# Patient Record
Sex: Female | Born: 1960 | Race: White | Hispanic: No | Marital: Married | Smoking: Never smoker
Health system: Southern US, Community
[De-identification: ages and names within clinical notes are randomized; demographics above are authoritative.]

## PROBLEM LIST (undated history)

## (undated) DIAGNOSIS — T7840XA Allergy, unspecified, initial encounter: Secondary | ICD-10-CM

## (undated) DIAGNOSIS — I739 Peripheral vascular disease, unspecified: Secondary | ICD-10-CM

## (undated) HISTORY — DX: Allergy, unspecified, initial encounter: T78.40XA

## (undated) HISTORY — PX: SALPINGOOPHORECTOMY: SHX82

## (undated) HISTORY — DX: Peripheral vascular disease, unspecified: I73.9

## (undated) HISTORY — PX: OTHER SURGICAL HISTORY: SHX169

## (undated) HISTORY — PX: COLPOSCOPY: SHX161

---

## 2012-06-29 DIAGNOSIS — Z8249 Family history of ischemic heart disease and other diseases of the circulatory system: Secondary | ICD-10-CM | POA: Insufficient documentation

## 2012-07-29 DIAGNOSIS — I491 Atrial premature depolarization: Secondary | ICD-10-CM | POA: Insufficient documentation

## 2012-07-29 DIAGNOSIS — I493 Ventricular premature depolarization: Secondary | ICD-10-CM | POA: Insufficient documentation

## 2012-08-07 DIAGNOSIS — R9431 Abnormal electrocardiogram [ECG] [EKG]: Secondary | ICD-10-CM | POA: Insufficient documentation

## 2012-10-01 DIAGNOSIS — I4949 Other premature depolarization: Secondary | ICD-10-CM | POA: Insufficient documentation

## 2012-10-01 DIAGNOSIS — R002 Palpitations: Secondary | ICD-10-CM | POA: Insufficient documentation

## 2013-12-29 ENCOUNTER — Ambulatory Visit: Payer: Self-pay | Admitting: Gastroenterology

## 2014-01-03 ENCOUNTER — Ambulatory Visit: Payer: Self-pay | Admitting: Gastroenterology

## 2014-02-10 DIAGNOSIS — R1013 Epigastric pain: Secondary | ICD-10-CM | POA: Insufficient documentation

## 2014-02-10 DIAGNOSIS — N83209 Unspecified ovarian cyst, unspecified side: Secondary | ICD-10-CM | POA: Insufficient documentation

## 2014-07-07 ENCOUNTER — Ambulatory Visit: Payer: Self-pay | Admitting: Unknown Physician Specialty

## 2014-08-04 DIAGNOSIS — M25561 Pain in right knee: Secondary | ICD-10-CM | POA: Insufficient documentation

## 2015-01-27 DIAGNOSIS — R140 Abdominal distension (gaseous): Secondary | ICD-10-CM | POA: Insufficient documentation

## 2015-01-27 DIAGNOSIS — K589 Irritable bowel syndrome without diarrhea: Secondary | ICD-10-CM | POA: Insufficient documentation

## 2016-02-01 HISTORY — PX: ESOPHAGOGASTRODUODENOSCOPY: SHX1529

## 2016-06-21 DIAGNOSIS — K219 Gastro-esophageal reflux disease without esophagitis: Secondary | ICD-10-CM | POA: Insufficient documentation

## 2016-06-21 DIAGNOSIS — K449 Diaphragmatic hernia without obstruction or gangrene: Secondary | ICD-10-CM

## 2016-08-06 ENCOUNTER — Other Ambulatory Visit: Payer: Self-pay | Admitting: Family Medicine

## 2016-08-06 DIAGNOSIS — K219 Gastro-esophageal reflux disease without esophagitis: Secondary | ICD-10-CM

## 2016-08-06 DIAGNOSIS — R142 Eructation: Secondary | ICD-10-CM

## 2016-08-13 ENCOUNTER — Ambulatory Visit
Admission: RE | Admit: 2016-08-13 | Discharge: 2016-08-13 | Disposition: A | Discharge status: Home / Self Care | Payer: 59 | Source: Ambulatory Visit | Attending: Family Medicine | Admitting: Family Medicine

## 2016-08-13 DIAGNOSIS — K317 Polyp of stomach and duodenum: Secondary | ICD-10-CM | POA: Diagnosis not present

## 2016-08-13 DIAGNOSIS — K449 Diaphragmatic hernia without obstruction or gangrene: Secondary | ICD-10-CM | POA: Insufficient documentation

## 2016-08-13 DIAGNOSIS — R142 Eructation: Secondary | ICD-10-CM | POA: Diagnosis present

## 2016-08-13 DIAGNOSIS — K219 Gastro-esophageal reflux disease without esophagitis: Secondary | ICD-10-CM

## 2016-08-26 DIAGNOSIS — H11003 Unspecified pterygium of eye, bilateral: Secondary | ICD-10-CM | POA: Insufficient documentation

## 2016-10-31 ENCOUNTER — Ambulatory Visit (INDEPENDENT_AMBULATORY_CARE_PROVIDER_SITE_OTHER): Payer: 59 | Admitting: Vascular Surgery

## 2016-10-31 ENCOUNTER — Encounter (INDEPENDENT_AMBULATORY_CARE_PROVIDER_SITE_OTHER): Payer: Self-pay | Admitting: Vascular Surgery

## 2016-10-31 DIAGNOSIS — J4520 Mild intermittent asthma, uncomplicated: Secondary | ICD-10-CM

## 2016-10-31 DIAGNOSIS — I8312 Varicose veins of left lower extremity with inflammation: Secondary | ICD-10-CM

## 2016-10-31 DIAGNOSIS — I10 Essential (primary) hypertension: Secondary | ICD-10-CM | POA: Insufficient documentation

## 2016-10-31 DIAGNOSIS — I872 Venous insufficiency (chronic) (peripheral): Secondary | ICD-10-CM | POA: Diagnosis not present

## 2016-10-31 DIAGNOSIS — M79609 Pain in unspecified limb: Secondary | ICD-10-CM | POA: Insufficient documentation

## 2016-10-31 DIAGNOSIS — J45909 Unspecified asthma, uncomplicated: Secondary | ICD-10-CM | POA: Insufficient documentation

## 2016-10-31 DIAGNOSIS — M79604 Pain in right leg: Secondary | ICD-10-CM | POA: Diagnosis not present

## 2016-10-31 DIAGNOSIS — I8311 Varicose veins of right lower extremity with inflammation: Secondary | ICD-10-CM | POA: Diagnosis not present

## 2016-10-31 DIAGNOSIS — M79605 Pain in left leg: Secondary | ICD-10-CM

## 2016-10-31 NOTE — Progress Notes (Signed)
MRN : [id number]  Barbara Glover is a 56 y.o. ([birth year]) female who presents with chief complaint of  Chief Complaint  Patient presents with  . New Evaluation    Varicose veins  .  History of Present Illness: The patient is seen for evaluation of symptomatic varicose veins. The patient relates burning and stinging which worsened steadily throughout the course of the day, particularly with standing. The patient also notes an aching and throbbing pain over the varicosities, particularly with prolonged dependent positions. The symptoms are significantly improved with elevation.  The patient also notes that during hot weather the symptoms are greatly intensified. The patient states the pain from the varicose veins interferes with work, daily exercise, shopping and household maintenance. At this point, the symptoms are persistent and severe enough that they're having a negative impact on lifestyle and are interfering with daily activities.  There is no history of DVT, PE or superficial thrombophlebitis. There is no history of ulceration or hemorrhage. The patient denies a significant family history of varicose veins.   The patient has not worn graduated compression in the past. At the present time the patient has not been using over-the-counter analgesics. There is no history of prior sclerotherapy which did not work.  However, she was told she has reflux and should have a laser ablation but she refused.   Current Meds  Medication Sig  . ALPRAZolam (XANAX) 0.25 MG tablet take 1 tablet by mouth at bedtime if needed  . B Complex Vitamins (VITAMIN B COMPLEX PO) Take by mouth.  . clonazePAM (KLONOPIN) 1 MG tablet   . Coenzyme Q10 100 MG capsule Take by mouth.  . Desvenlafaxine Succinate ER 25 MG TB24   . diltiazem (CARDIZEM) 30 MG tablet   . EPINEPHrine 0.3 mg/0.3 mL IJ SOAJ injection Inject 0.3 mg into the muscle.  . eszopiclone (LUNESTA) 1 MG TABS tablet   . fluticasone (FLONASE) 50  MCG/ACT nasal spray 1 spray by Each Nare route Two (2) times a day.  . montelukast (SINGULAIR) 10 MG tablet TAKE 1 TABLET BY MOUTH EVERY NIGHT AT BEDTIME  . Multiple Vitamins-Minerals (WOMENS MULTI) CAPS Take by mouth.  . prednisoLONE acetate (PRED FORTE) 1 % ophthalmic suspension INT 1 GTT REY QID  . vitamin C (ASCORBIC ACID) 500 MG tablet Take by mouth.    Past Medical History:  Diagnosis Date  . Allergy   . Peripheral vascular disease ([place])     No past surgical history on file.  Social History Social History  Substance Use Topics  . Smoking status: Never Smoker  . Smokeless tobacco: Never Used  . Alcohol use 1.2 oz/week    2 Glasses of wine per week    Family History No family history on file. No family history of bleeding/clotting disorders, porphyria or autoimmune disease   Allergies  Allergen Reactions  . Morphine     Other reaction(s): Nausea And Vomiting  . Bee Venom Swelling  . Codeine Nausea Only  . Protamine     Other reaction(s): Other (See Comments) Acute asthma attack  . Statins     Other reaction(s): Muscle Pain     REVIEW OF SYSTEMS (Negative unless checked)  Constitutional: [] Weight loss  [] Fever  [] Chills Cardiac: [] Chest pain   [] Chest pressure   [] Palpitations   [] Shortness of breath when laying flat   [] Shortness of breath with exertion. Vascular:  [] Pain in legs with walking   [] Pain in legs at rest  [] History of DVT   []   Phlebitis   [x] Swelling in legs   [x] Varicose veins   [] Non-healing ulcers Pulmonary:   [] Uses home oxygen   [] Productive cough   [] Hemoptysis   [] Wheeze  [] COPD   [] Asthma Neurologic:  [] Dizziness   [] Seizures   [] History of stroke   [] History of TIA  [] Aphasia   [] Vissual changes   [] Weakness or numbness in arm   [] Weakness or numbness in leg Musculoskeletal:   [] Joint swelling   [] Joint pain   [] Low back pain Hematologic:  [] Easy bruising  [] Easy bleeding   [] Hypercoagulable state   [] Anemic Gastrointestinal:  [] Diarrhea    [] Vomiting  [] Gastroesophageal reflux/heartburn   [] Difficulty swallowing. Genitourinary:  [] Chronic kidney disease   [] Difficult urination  [] Frequent urination   [] Blood in urine Skin:  [] Rashes   [] Ulcers  Psychological:  [] History of anxiety   []  History of major depression.  Physical Examination  Vitals:   10/31/16 1109  BP: (!) 143/86  Pulse: 82  Resp: 16  Weight: 194 lb (88 kg)  Height: 5' 4.5" (1.638 m)   Body mass index is 32.79 kg/m. Gen: WD/WN, NAD Head: [place]/AT, No temporalis wasting.  Ear/Nose/Throat: Hearing grossly intact, nares w/o erythema or drainage, poor dentition Eyes: PER, EOMI, sclera nonicteric.  Neck: Supple, no masses.  No bruit or JVD.  Pulmonary:  Good air movement, clear to auscultation bilaterally, no use of accessory muscles.  Cardiac: RRR, normal S1, S2, no Murmurs. Vascular:  Bilateral varicose veins of the lower extremities with >8 mm size and moderate venous stasis disease Vessel Right Left  Radial Palpable Palpable  Ulnar Palpable Palpable  Brachial Palpable Palpable  Carotid Palpable Palpable  Femoral Palpable Palpable  Popliteal Palpable Palpable  PT Palpable Palpable  DP Palpable Palpable   Gastrointestinal: soft, non-distended. No guarding/no peritoneal signs.  Musculoskeletal: M/S 5/5 throughout.  No deformity or atrophy.  Neurologic: CN 2-12 intact. Pain and light touch intact in extremities.  Symmetrical.  Speech is fluent. Motor exam as listed above. Psychiatric: Judgment intact, Mood & affect appropriate for pt's clinical situation. Dermatologic: No rashes or ulcers noted.  No changes consistent with cellulitis. Lymph : No Cervical lymphadenopathy, no lichenification or skin changes of chronic lymphedema.  CBC No results found for: WBC, HGB, HCT, MCV, PLT  BMET No results found for: NA, K, CL, CO2, GLUCOSE, BUN, CREATININE, CALCIUM, GFRNONAA, GFRAA CrCl cannot be calculated (No order found.).  COAG No results found for: INR,  PROTIME  Radiology No results found.  Assessment/Plan 1. Pain in both lower extremities  Recommend:  The patient has large symptomatic varicose veins that are painful and associated with swelling.  I have had a long discussion with the patient regarding  varicose veins and why they cause symptoms.  Patient will begin wearing graduated compression stockings class 1 on a daily basis, beginning first thing in the morning and removing them in the evening. The patient is instructed specifically not to sleep in the stockings.    The patient  will also begin using over-the-counter analgesics such as Motrin 600 mg po TID to help control the symptoms.    In addition, behavioral modification including elevation during the day will be initiated.    Pending the results of these changes the  patient will be reevaluated in three months.   An  ultrasound of the venous system will be obtained.   Further plans will be based on the ultrasound results and whether conservative therapies are successful at eliminating the pain and swelling.  2. Varicose veins of both lower extremities with inflammation See #1 - VAS [place] LOWER EXTREMITY VENOUS REFLUX; Future  3. Chronic venous insufficiency See #1  4. Essential hypertension Continue antihypertensive medications as already ordered, these medications have been reviewed and there are no changes at this time.   5. Mild intermittent chronic asthma without complication Continue pulmonary medications and aerosols as already ordered, these medications have been reviewed and there are no changes at this time.   Hortencia Pilar, MD  10/31/2016 1:34 PM

## 2017-01-30 ENCOUNTER — Ambulatory Visit (INDEPENDENT_AMBULATORY_CARE_PROVIDER_SITE_OTHER): Payer: 59

## 2017-01-30 ENCOUNTER — Ambulatory Visit (INDEPENDENT_AMBULATORY_CARE_PROVIDER_SITE_OTHER): Payer: 59 | Admitting: Vascular Surgery

## 2017-01-30 ENCOUNTER — Encounter (INDEPENDENT_AMBULATORY_CARE_PROVIDER_SITE_OTHER): Payer: Self-pay | Admitting: Vascular Surgery

## 2017-01-30 VITALS — BP 133/87 | HR 72 | Resp 16 | Wt 195.0 lb

## 2017-01-30 DIAGNOSIS — I872 Venous insufficiency (chronic) (peripheral): Secondary | ICD-10-CM

## 2017-01-30 DIAGNOSIS — I8311 Varicose veins of right lower extremity with inflammation: Secondary | ICD-10-CM | POA: Diagnosis not present

## 2017-01-30 DIAGNOSIS — I10 Essential (primary) hypertension: Secondary | ICD-10-CM

## 2017-01-30 DIAGNOSIS — M79605 Pain in left leg: Secondary | ICD-10-CM | POA: Diagnosis not present

## 2017-01-30 DIAGNOSIS — I8312 Varicose veins of left lower extremity with inflammation: Secondary | ICD-10-CM

## 2017-01-30 DIAGNOSIS — M79604 Pain in right leg: Secondary | ICD-10-CM | POA: Diagnosis not present

## 2017-02-02 NOTE — Progress Notes (Signed)
MRN : [id number]  Barbara Glover is a 56 y.o. ([birth year]) female who presents with chief complaint of  Chief Complaint  Patient presents with  . Follow-up  .  History of Present Illness: The patient returns for followup evaluation 3 months after the initial visit. The patient continues to have pain in the lower extremities with dependency. The pain is lessened with elevation. Graduated compression stockings, Class I (20-30 mmHg), have been worn but the stockings do not eliminate the leg pain. Over-the-counter analgesics do not improve the symptoms. The degree of discomfort continues to interfere with daily activities. The patient notes the pain in the legs is causing problems with daily exercise, at the workplace and even with household activities and maintenance such as standing in the kitchen preparing meals and doing dishes.   Venous ultrasound shows normal deep venous system, no evidence of acute or chronic DVT.  Superficial reflux is present in bilateral GSV and SSV.  Current Meds  Medication Sig  . B Complex Vitamins (VITAMIN B COMPLEX PO) Take by mouth.  . bismuth subsalicylate (PEPTO BISMOL) 262 MG/15ML suspension Take 30 mLs by mouth daily.  . clonazePAM (KLONOPIN) 1 MG tablet 0.5 mg as needed.   . Coenzyme Q10 100 MG capsule Take by mouth.  . Desvenlafaxine Succinate ER 25 MG TB24 25 mg.   . EPINEPHrine 0.3 mg/0.3 mL IJ SOAJ injection Inject 0.3 mg into the muscle.  . eszopiclone (LUNESTA) 1 MG TABS tablet   . Multiple Vitamins-Minerals (WOMENS MULTI) CAPS Take by mouth.  . omega-3 acid ethyl esters (LOVAZA) 1 g capsule Take by mouth daily.  . prednisoLONE acetate (PRED FORTE) 1 % ophthalmic suspension INT 1 GTT REY QID  . vitamin C (ASCORBIC ACID) 500 MG tablet Take by mouth.    Past Medical History:  Diagnosis Date  . Allergy   . Peripheral vascular disease ([place])     No past surgical history on file.  Social History Social History  Substance Use Topics  .  Smoking status: Never Smoker  . Smokeless tobacco: Never Used  . Alcohol use 1.2 oz/week    2 Glasses of wine per week    Family History No family history of bleeding/clotting disorders, porphyria or autoimmune disease   Allergies  Allergen Reactions  . Morphine     Other reaction(s): Nausea And Vomiting  . Bee Venom Swelling  . Codeine Nausea Only  . Protamine     Other reaction(s): Other (See Comments) Acute asthma attack  . Statins     Other reaction(s): Muscle Pain     REVIEW OF SYSTEMS (Negative unless checked)  Constitutional: [] Weight loss  [] Fever  [] Chills Cardiac: [] Chest pain   [] Chest pressure   [] Palpitations   [] Shortness of breath when laying flat   [] Shortness of breath with exertion. Vascular:  [] Pain in legs with walking   [x] Pain in legs with standing  [] History of DVT   [] Phlebitis   [x] Swelling in legs   [x] Varicose veins   [] Non-healing ulcers Pulmonary:   [] Uses home oxygen   [] Productive cough   [] Hemoptysis   [] Wheeze  [] COPD   [] Asthma Neurologic:  [] Dizziness   [] Seizures   [] History of stroke   [] History of TIA  [] Aphasia   [] Vissual changes   [] Weakness or numbness in arm   [] Weakness or numbness in leg Musculoskeletal:   [] Joint swelling   [] Joint pain   [] Low back pain Hematologic:  [] Easy bruising  [] Easy bleeding   [] Hypercoagulable state   []   Anemic Gastrointestinal:  [] Diarrhea   [] Vomiting  [] Gastroesophageal reflux/heartburn   [] Difficulty swallowing. Genitourinary:  [] Chronic kidney disease   [] Difficult urination  [] Frequent urination   [] Blood in urine Skin:  [] Rashes   [] Ulcers  Psychological:  [] History of anxiety   []  History of major depression.  Physical Examination  Vitals:   01/30/17 1536  BP: 133/87  Pulse: 72  Resp: 16  Weight: 195 lb (88.5 kg)   Body mass index is 32.95 kg/m. Gen: WD/WN, NAD Head: [place]/AT, No temporalis wasting.  Ear/Nose/Throat: Hearing grossly intact, nares w/o erythema or drainage Eyes: PER, EOMI,  sclera nonicteric.  Neck: Supple, no large masses.   Pulmonary:  Good air movement, no audible wheezing bilaterally, no use of accessory muscles.  Cardiac: RRR, no JVD Vascular: Large varicosities present extensively greater than 8 mm bilaterally.  Moderate venous stasis changes to the legs bilaterally.  2+ soft pitting edema Vessel Right Left  PT Palpable Palpable  DP Palpable Palpable  Gastrointestinal: Non-distended. No guarding/no peritoneal signs.  Musculoskeletal: M/S 5/5 throughout.  No deformity or atrophy.  Neurologic: CN 2-12 intact. Symmetrical.  Speech is fluent. Motor exam as listed above. Psychiatric: Judgment intact, Mood & affect appropriate for pt's clinical situation. Dermatologic:venous rashes but no ulcers noted.  No changes consistent with cellulitis. Lymph : No lichenification or skin changes of chronic lymphedema.  CBC No results found for: WBC, HGB, HCT, MCV, PLT  BMET No results found for: NA, K, CL, CO2, GLUCOSE, BUN, CREATININE, CALCIUM, GFRNONAA, GFRAA CrCl cannot be calculated (No order found.).  COAG No results found for: INR, PROTIME  Radiology No results found.  Assessment/Plan 1. Varicose veins of both lower extremities with inflammation Recommend  I have reviewed my previous  discussion with the patient regarding  varicose veins and why they cause symptoms. Patient will continue  wearing graduated compression stockings class 1 on a daily basis, beginning first thing in the morning and removing them in the evening.    In addition, behavioral modification including elevation during the day was again discussed and this will continue.  The patient has utilized over the counter pain medications and has been exercising.  However, at this time conservative therapy has not alleviated the patient's symptoms of leg pain and swelling  Recommend: laser ablation of the right and  left great saphenous and small veins to eliminate the symptoms of pain and  swelling of the lower extremities caused by the severe superficial venous reflux disease. Her left leg is more painful so this one will be addressed first  2. Chronic venous insufficiency No surgery or intervention at this point in time.    I have had a long discussion with the patient regarding venous insufficiency and why it  causes symptoms. I have discussed with the patient the chronic skin changes that accompany venous insufficiency and the long term sequela such as infection and ulceration.  Patient will begin wearing graduated compression stockings class 1 (20-30 mmHg) or compression wraps on a daily basis a prescription was given. The patient will put the stockings on first thing in the morning and removing them in the evening. The patient is instructed specifically not to sleep in the stockings.    In addition, behavioral modification including several periods of elevation of the lower extremities during the day will be continued. I have demonstrated that proper elevation is a position with the ankles at heart level.  The patient is instructed to begin routine exercise, especially walking on a daily  basis  Following the review of the ultrasound the patient will follow up in 2-3 months to reassess the degree of swelling and the control that graduated compression stockings or compression wraps  is offering.   The patient can be assessed for a Lymph Pump at that time  3. Essential hypertension Continue antihypertensive medications as already ordered, these medications have been reviewed and there are no changes at this time.   4. Pain in both lower extremities See #1&2    Hortencia Pilar, MD  02/02/2017 6:02 PM

## 2017-03-24 ENCOUNTER — Other Ambulatory Visit: Payer: Self-pay | Admitting: Vascular Surgery

## 2017-03-24 DIAGNOSIS — I8311 Varicose veins of right lower extremity with inflammation: Secondary | ICD-10-CM

## 2017-03-24 DIAGNOSIS — I8312 Varicose veins of left lower extremity with inflammation: Principal | ICD-10-CM

## 2017-06-20 DIAGNOSIS — R142 Eructation: Secondary | ICD-10-CM | POA: Insufficient documentation

## 2017-08-23 ENCOUNTER — Encounter: Payer: Self-pay | Admitting: Gynecology

## 2017-08-23 ENCOUNTER — Other Ambulatory Visit: Payer: Self-pay

## 2017-08-23 ENCOUNTER — Ambulatory Visit
Admission: EM | Admit: 2017-08-23 | Discharge: 2017-08-23 | Disposition: A | Discharge status: Home / Self Care | Payer: 59 | Attending: Emergency Medicine | Admitting: Emergency Medicine

## 2017-08-23 DIAGNOSIS — K122 Cellulitis and abscess of mouth: Secondary | ICD-10-CM | POA: Diagnosis not present

## 2017-08-23 LAB — RAPID STREP SCREEN (MED CTR MEBANE ONLY): STREPTOCOCCUS, GROUP A SCREEN (DIRECT): NEGATIVE

## 2017-08-23 MED ORDER — AMOXICILLIN-POT CLAVULANATE 875-125 MG PO TABS: 1.0000 | ORAL_TABLET | Freq: Two times a day (BID) | ORAL | 0 refills | Status: AC

## 2017-08-23 NOTE — ED Provider Notes (Signed)
HPI  SUBJECTIVE:  Patient reports sore throat starting  this morning.  States that she feels like something is stuck in her throat.  Symptoms are worse with swallowing.  No alleviating factors. No fevers No swollen neck glands    + Cough/URI sxs with nasal congestion, rhinorrhea, postnasal drip, but she states that this is unchanged from baseline + Myalgias No Headache No Rash     No Recent Strep or mono exposure, however she has sick contacts with URI symptoms No Abdominal Pain No reflux sxs No Allergy sxs  No Breathing difficulty, voice changes No Drooling No Trismus No abx in past month.  Patient has a past medical history of severe GERD/esophagitis, pneumonia, seasonal allergies.  No history of recurrent strep, mono, diabetes, hypertension.  She is not a smoker.   NAT:[id number], Titus Mould, DO    Past Medical History:  Diagnosis Date  . Allergy   . Peripheral vascular disease ([place])     History reviewed. No pertinent surgical history.  No family history on file.  Social History   Tobacco Use  . Smoking status: Never Smoker  . Smokeless tobacco: Never Used  Substance Use Topics  . Alcohol use: Yes    Alcohol/week: 1.2 oz    Types: 2 Glasses of wine per week  . Drug use: No    No current facility-administered medications for this encounter.   Current Outpatient Medications:  .  B Complex Vitamins (VITAMIN B COMPLEX PO), Take by mouth., Disp: , Rfl:  .  bismuth subsalicylate (PEPTO BISMOL) 262 MG/15ML suspension, Take 30 mLs by mouth daily., Disp: , Rfl:  .  clonazePAM (KLONOPIN) 1 MG tablet, 0.5 mg as needed. , Disp: , Rfl:  .  Coenzyme Q10 100 MG capsule, Take by mouth., Disp: , Rfl:  .  Desvenlafaxine Succinate ER 25 MG TB24, 25 mg. , Disp: , Rfl:  .  diltiazem (CARDIZEM) 30 MG tablet, , Disp: , Rfl:  .  EPINEPHrine 0.3 mg/0.3 mL IJ SOAJ injection, Inject 0.3 mg into the muscle., Disp: , Rfl:  .  eszopiclone (LUNESTA) 1 MG TABS tablet, , Disp: ,  Rfl:  .  fluticasone (FLONASE) 50 MCG/ACT nasal spray, 1 spray by Each Nare route Two (2) times a day., Disp: , Rfl:  .  montelukast (SINGULAIR) 10 MG tablet, TAKE 1 TABLET BY MOUTH EVERY NIGHT AT BEDTIME, Disp: , Rfl:  .  Multiple Vitamins-Minerals (WOMENS MULTI) CAPS, Take by mouth., Disp: , Rfl:  .  omega-3 acid ethyl esters (LOVAZA) 1 g capsule, Take by mouth daily., Disp: , Rfl:  .  vitamin C (ASCORBIC ACID) 500 MG tablet, Take by mouth., Disp: , Rfl:  .  ALPRAZolam (XANAX) 0.25 MG tablet, take 1 tablet by mouth at bedtime if needed, Disp: , Rfl:  .  amoxicillin-clavulanate (AUGMENTIN) 875-125 MG tablet, Take 1 tablet by mouth 2 (two) times daily., Disp: 20 tablet, Rfl: 0 .  prednisoLONE acetate (PRED FORTE) 1 % ophthalmic suspension, INT 1 GTT REY QID, Disp: , Rfl: 0  Allergies  Allergen Reactions  . Morphine     Other reaction(s): Nausea And Vomiting  . Bee Venom Swelling  . Codeine Nausea Only  . Protamine     Other reaction(s): Other (See Comments) Acute asthma attack  . Statins     Other reaction(s): Muscle Pain     ROS  As noted in HPI.   Physical Exam  BP 134/84 (BP Location: Left Arm)   Pulse 94   Temp  98.4 F (36.9 C)   Resp 16   Wt 180 lb (81.6 kg)   LMP  (LMP Unknown)   SpO2 98%   BMI 30.42 kg/m   Constitutional: Well developed, well nourished, no acute distress Eyes:  EOMI, conjunctiva normal bilaterally HENT: Normocephalic, atraumatic,mucus membranes moist. + nasal congestion - erythematous oropharynx - enlarged tonsils - exudates. Uvula midline, edematous, slightly swollen.  No obvious postnasal drip. Respiratory: Normal inspiratory effort Cardiovascular: Normal rate, no murmurs, rubs, gallops GI: nondistended, nontender. No appreciable splenomegaly skin: No rash, skin intact Lymph: -cervical LN  Musculoskeletal: no deformities Neurologic: Alert & oriented x 3, no focal neuro deficits Psychiatric: Speech and behavior appropriate.  ED  Course   Medications - No data to display  Orders Placed This Encounter  Procedures  . Rapid strep screen    Standing Status:   Standing    Number of Occurrences:   1    Order Specific Question:   Patient immune status    Answer:   Normal  . Culture, group A strep    Standing Status:   Standing    Number of Occurrences:   1    No results found for this or any previous visit (from the past 24 hour(s)). No results found.  ED Clinical Impression  Uvulitis   ED Assessment/Plan   Rapid strep negative.  However, pt with uvular swelling,  viral versus bacterial however will err on the side of caution and treat as if this is a bacterial infection.  No evidence of airway obstruction.  Doubt epiglottitis, allergic reaction, retropharyngeal abscess, peritonsillar abscess, dental infection.  We will send home with Augmentin, ibu 600 mg take with 1 g of Tylenol 3-4 times a day . Patient to followup with PMD when necessary.   Discussed labs,  MDM, plan and followup with patient. Discussed sn/sx that should prompt return to the ED. patient agrees with plan.   Meds ordered this encounter  Medications  . amoxicillin-clavulanate (AUGMENTIN) 875-125 MG tablet    Sig: Take 1 tablet by mouth 2 (two) times daily.    Dispense:  20 tablet    Refill:  0     *This clinic note was created using [employer]. Therefore, there may be occasional mistakes despite careful proofreading.    Melynda Ripple, MD 08/24/17 1147

## 2017-08-23 NOTE — ED Triage Notes (Signed)
Per patient sore throat. Per patient felt like something stuck in her throat.

## 2017-08-23 NOTE — Discharge Instructions (Signed)
600 mg of ibuprofen to take with 1 g of Tylenol 3-4 times a day.  Start the Augmentin, this could be viral, but often this is caused by bacterial infections.

## 2017-08-26 LAB — CULTURE, GROUP A STREP (THRC)

## 2017-11-12 ENCOUNTER — Encounter: Payer: Self-pay | Admitting: Gastroenterology

## 2017-11-12 ENCOUNTER — Ambulatory Visit: Payer: 59 | Admitting: Gastroenterology

## 2017-11-12 ENCOUNTER — Other Ambulatory Visit: Payer: Self-pay

## 2017-11-12 VITALS — BP 129/73 | HR 82 | Ht 64.5 in | Wt 191.0 lb

## 2017-11-12 DIAGNOSIS — K5790 Diverticulosis of intestine, part unspecified, without perforation or abscess without bleeding: Secondary | ICD-10-CM | POA: Insufficient documentation

## 2017-11-12 DIAGNOSIS — F419 Anxiety disorder, unspecified: Secondary | ICD-10-CM | POA: Insufficient documentation

## 2017-11-12 DIAGNOSIS — R1013 Epigastric pain: Secondary | ICD-10-CM | POA: Diagnosis not present

## 2017-11-12 DIAGNOSIS — R142 Eructation: Secondary | ICD-10-CM | POA: Diagnosis not present

## 2017-11-12 DIAGNOSIS — D279 Benign neoplasm of unspecified ovary: Secondary | ICD-10-CM | POA: Insufficient documentation

## 2017-11-12 NOTE — Progress Notes (Signed)
Gastroenterology Consultation  Referring Provider:     Garald Balding* Primary Care Physician:  Gearldine Shown, DO Primary Gastroenterologist:  Dr. Allen Norris     Reason for Consultation:     Dyspepsia        HPI:   Barbara Glover is a 57 y.o. y/o female referred for consultation & management of Dyspepsia by Dr. Bobette Mo, Titus Mould, DO.  This patient comes today after being seen initially in [facility] by two different physicians for excessive burping.  The patient also reports that she has had abdominal bloating with abdominal pain.  She reports that all of these symptoms had started when she moved to [place] 5 years ago.  She also reports suffering from postnasal drip and has had an upper endoscopy in the past that showed LA grade B esophagitis caused by reflux.  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  The patient also had a negative hydrogen breath test.  The patient now comes in today because she continues to have symptoms which can happen in on a daily basis and she states usually happens at night.  She reports that she starts to burp large amounts of air.  This continues for many hours and she states that she is not able to get much sleep because of it and has not gone back to work because she has been having lack of sleep due to the burping.  The patient also states that she has been treated by physicians in the past for irritable bowel syndrome, depression and has been on multiple PPIs without any relief of her symptoms.  The patient also reports that she has intermittent bouts of diarrhea and she had an episode last night with watery diarrhea that lasted few hours. She reports that he did not have heartburn despite having esophagitis in the past.  She also states that when she does burp it is reported to be a wet burps with fluid coming up also.  The patient has a negative  celiac test in the past and states that she eats well and avoids foods that may worsen her symptoms.  She also has tried probiotics and abdominal massage.There is no report of any unexplained weight loss.  Denies chewing gum, no straw use, no sleep apnea machine, no artificial sweeteners, no carbonated beverages. Patient reports that she has a very strict diet - avoids eating out, tries to avoid lactose containing products. Does not eat after 6pm.   The patient has also been under a lot of stress since her husband has not been able to work for the last 5 months due to not being able to find a job.    Past Medical History:  Diagnosis Date  . Allergy   . Peripheral vascular disease [facility])     Past Surgical History:  Procedure Laterality Date  . COLPOSCOPY    . ESOPHAGOGASTRODUODENOSCOPY  02/01/2016  . SALPINGOOPHORECTOMY Right   . uterine ablation      Prior to Admission medications   Medication Sig Start Date End Date Taking? Authorizing Provider  B Complex Vitamins (VITAMIN B COMPLEX PO) Take by mouth.   Yes [provider]  bismuth subsalicylate (PEPTO BISMOL) 262 MG/15ML suspension Take 30 mLs by mouth daily.   Yes [provider]  Calcium Carbonate-Vitamin D (CALCIUM HIGH POTENCY/VITAMIN D) 600-200 MG-UNIT TABS Take by mouth.   Yes [provider]  clonazePAM Bobbye Charleston) 1  MG tablet 0.5 mg as needed.  10/22/16  Yes [provider]  Coenzyme Q10 100 MG capsule Take by mouth.   Yes [provider]  Desvenlafaxine Succinate ER 25 MG TB24 25 mg.  10/22/16  Yes [provider]  EPINEPHrine 0.3 mg/0.3 mL IJ SOAJ injection Inject 0.3 mg into the muscle. 05/02/14  Yes [provider]  fluticasone (FLONASE) 50 MCG/ACT nasal spray 1 spray by Each Nare route Two (2) times a day. 02/10/14  Yes [provider]  montelukast (SINGULAIR) 10 MG tablet TAKE 1 TABLET BY MOUTH EVERY NIGHT AT BEDTIME 05/12/13  Yes [provider]    Multiple Vitamins-Minerals (WOMENS MULTI) CAPS Take by mouth.   Yes [provider]  omega-3 acid ethyl esters (LOVAZA) 1 g capsule Take by mouth daily.   Yes [provider]  ondansetron (ZOFRAN) 8 MG tablet  10/22/17  Yes [provider]  vitamin C (ASCORBIC ACID) 500 MG tablet Take by mouth.   Yes [provider]  zolpidem (AMBIEN) 5 MG tablet  10/15/17  Yes [provider]  ALPRAZolam Duanne Moron) 0.25 MG tablet take 1 tablet by mouth at bedtime if needed 04/02/13   [provider]  amoxicillin-clavulanate (AUGMENTIN) 875-125 MG tablet Take 1 tablet by mouth 2 (two) times daily. Patient not taking: Reported on 11/12/2017 08/23/17   Melynda Ripple, MD  diltiazem (CARDIZEM) 30 MG tablet  08/01/16   [provider]  eszopiclone (LUNESTA) 1 MG TABS tablet  05/22/16   [provider]  levalbuterol (XOPENEX) 1.25 MG/3ML nebulizer solution INHALE CONTENTS 1 VIAL VIA NEBULIZER EVERY 6 HOURS AS NEEDED FOR SHORTNESS OF BREATH/ WHEEZING 10/25/16   [provider]  prednisoLONE acetate (PRED FORTE) 1 % ophthalmic suspension INT 1 GTT REY QID 09/13/16   [provider]  ranitidine (ZANTAC) 150 MG capsule Take by mouth. 12/31/16 12/31/17  [provider]    Family History  Problem Relation Age of Onset  . Alcohol abuse Mother   . Anxiety disorder Mother   . Bipolar disorder Mother   . Hypertension Mother   . Coronary artery disease Brother   . Hyperlipidemia Maternal Grandmother   . Hypertension Maternal Grandmother   . Obesity Maternal Grandmother   . Hyperlipidemia Maternal Grandfather   . Obesity Paternal Grandmother   . Stroke Paternal Grandmother   . Diabetes Mellitus II Paternal Grandfather      Social History   Tobacco Use  . Smoking status: Never Smoker  . Smokeless tobacco: Never Used  Substance Use Topics  . Alcohol use: Yes    Alcohol/week: 1.2 oz    Types: 2 Glasses of wine per week  .  Drug use: No    Allergies as of 11/12/2017 - Review Complete 11/12/2017  Allergen Reaction Noted  . Morphine  09/03/2016  . Bee venom Swelling 02/01/2016  . Codeine Nausea Only 06/07/2013  . Protamine  09/03/2016  . Statins  06/08/2015    Review of Systems:    All systems reviewed and negative except where noted in HPI.   Physical Exam:  BP 129/73   Pulse 82   Ht 5' 4.5" (1.638 m)   Wt 191 lb (86.6 kg)   LMP  (LMP Unknown)   BMI 32.28 kg/m  No LMP recorded (lmp unknown). Patient is postmenopausal. Psych:  Alert and cooperative. Normal mood and affect. General:   Alert,  Well-developed, well-nourished, pleasant and cooperative in NAD Head:  Normocephalic and atraumatic. Eyes:  Sclera  clear, no icterus.   Conjunctiva pink. Ears:  Normal auditory acuity. Nose:  No deformity, discharge, or lesions. Mouth:  No deformity or lesions,oropharynx pink & moist. Neck:  Supple; no masses or thyromegaly. Lungs:  Respirations even and unlabored.  Clear throughout to auscultation.   No wheezes, crackles, or rhonchi. No acute distress. Heart:  Regular rate and rhythm; no murmurs, clicks, rubs, or gallops. Abdomen:  Normal bowel sounds.  No bruits.  Soft, non-tender and non-distended without masses, hepatosplenomegaly or hernias noted.  No guarding or rebound tenderness.  Negative Carnett sign.   Rectal:  Deferred.  Msk:  Symmetrical without gross deformities.  Good, equal movement & strength bilaterally. Pulses:  Normal pulses noted. Extremities:  No clubbing or edema.  No cyanosis. Neurologic:  Alert and oriented x3;  grossly normal neurologically. Skin:  Intact without significant lesions or rashes.  No jaundice. Lymph Nodes:  No significant cervical adenopathy. Psych:  Alert and cooperative. Normal mood and affect.  Imaging Studies: No results found.  Assessment and Plan:   Barbara Glover is a 57 y.o. y/o female With what sounds like aerophagia.  The patient did not have any  burping during our visit today and from her last consult note does not appear that she had any burping at that time. The patient has been told that there must be a trigger to caused her to swallow excessive amounts of air.  The patient also has her symptoms in the evening and at night but typically not during the day.  She has been told to go back on a PPI since she had esophagitis in the past and reports that she has a hiatal hernia.  The patient has also been told that she should start taking the allergy medication she was prescribed in the past because chronic postnasal drip can cause her to swallow a lot of air thereby causing her burping and bloating. The patient has been explained the plan and agrees with it.  Lucilla Lame, MD. Marval Regal   Note: This dictation was prepared with Dragon dictation along with smaller phrase technology. Any transcriptional errors that result from this process are unintentional.

## 2018-02-02 ENCOUNTER — Ambulatory Visit (INDEPENDENT_AMBULATORY_CARE_PROVIDER_SITE_OTHER): Payer: 59 | Admitting: Vascular Surgery

## 2018-02-02 ENCOUNTER — Encounter (INDEPENDENT_AMBULATORY_CARE_PROVIDER_SITE_OTHER): Payer: 59

## 2018-07-25 IMAGING — RF DG UGI W/O KUB
12 series · 12 of 12 positions shown · non-contrast
Comparison: 01/03/2014.

CLINICAL DATA: Frequent belching. History of gastritis, hiatal
hernia, reflux.

EXAM:
UPPER GI SERIES WITHOUT KUB
TECHNIQUE: Routine upper GI series was performed with high density and thin
barium.
FLUOROSCOPY TIME:  Fluoroscopy Time:  1 minutes 20 seconds.
Radiation Exposure Index (if provided by the fluoroscopic device):
37.1 mGy
Number of Acquired Spot Images: 12

[Series 1: fluoro_barium singleshot_bw · 0.18mm/px · 1 of 1 slices shown (1 of 12)]
[im 1/1]
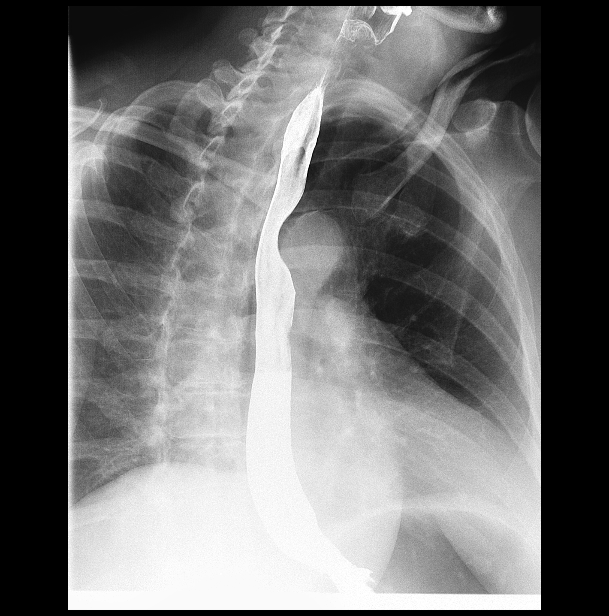

[Series 2: fluoro_barium singleshot_bw · 0.18mm/px · 1 of 1 slices shown (2 of 12)]
[im 1/1]
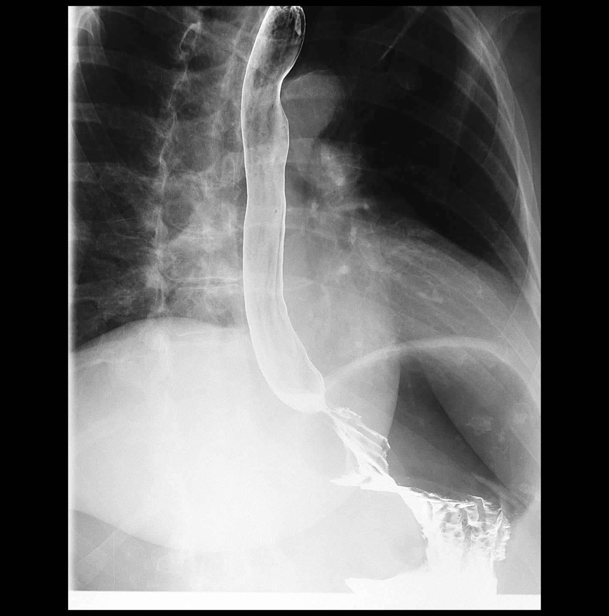

[Series 3: fluoro_barium singleshot_bw · 0.18mm/px · 1 of 1 slices shown (3 of 12)]
[im 1/1]
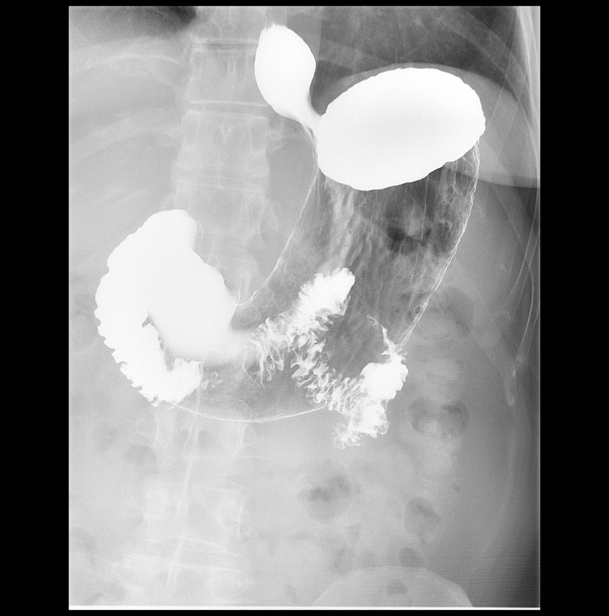

[Series 4: fluoro_barium singleshot_bw · 0.18mm/px · 1 of 1 slices shown (4 of 12)]
[im 1/1]
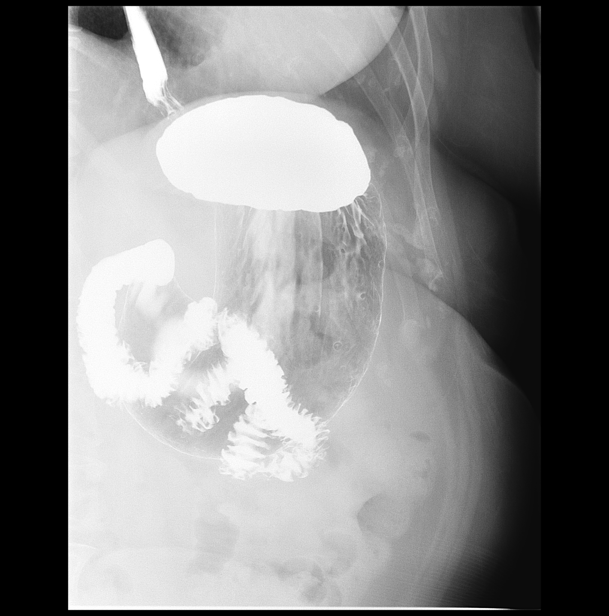

[Series 5: fluoro_barium singleshot_bw · 0.18mm/px · 1 of 1 slices shown (5 of 12)]
[im 1/1]
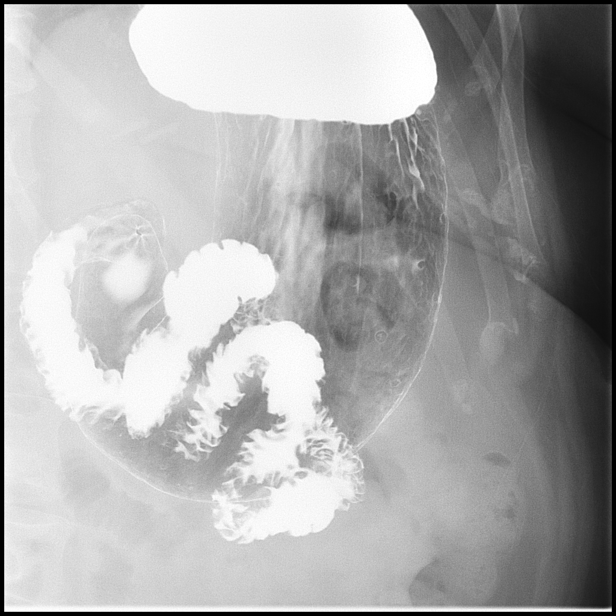

[Series 6: fluoro_barium singleshot_bw · 0.18mm/px · 1 of 1 slices shown (6 of 12)]
[im 1/1]
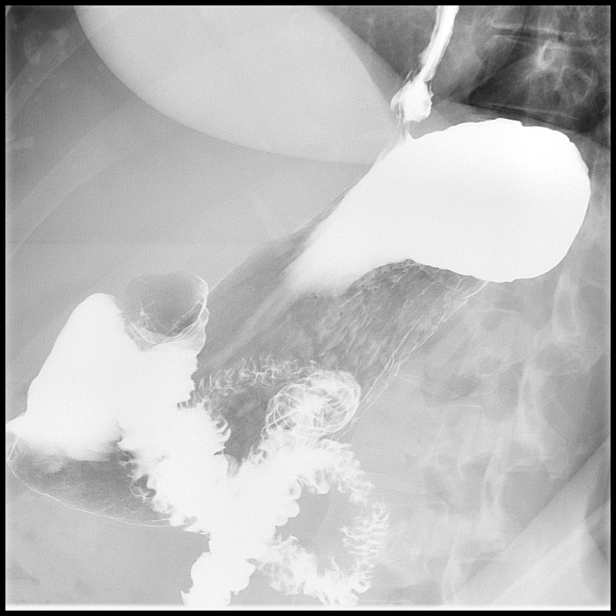

[Series 7: fluoro_barium singleshot_bw · 0.18mm/px · 1 of 1 slices shown (7 of 12)]
[im 1/1]
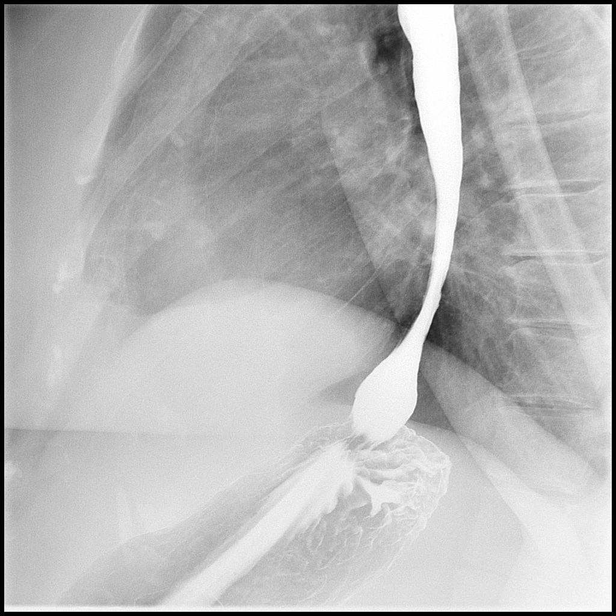

[Series 8: fluoro_barium singleshot_bw · 0.18mm/px · 1 of 1 slices shown (8 of 12)]
[im 1/1]
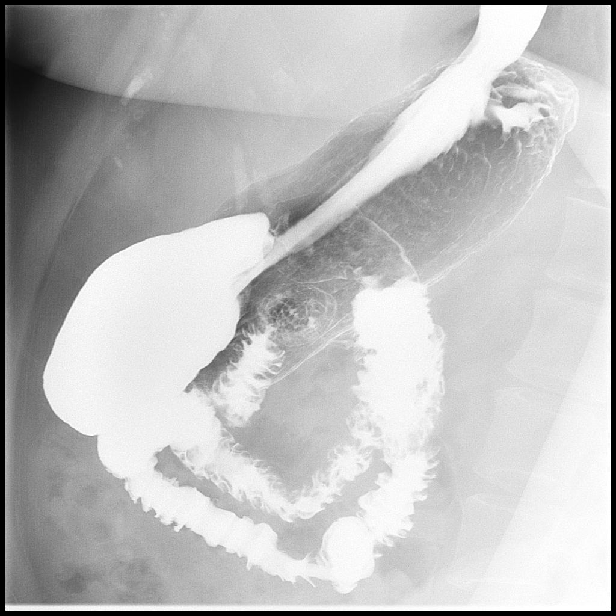

[Series 9: fluoro_barium singleshot_bw · 0.18mm/px · 1 of 1 slices shown (9 of 12)]
[im 1/1]
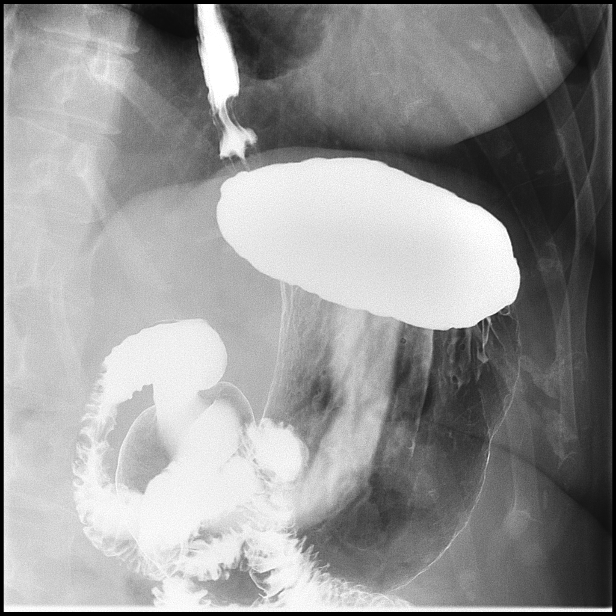

[Series 10: fluoro_barium singleshot_bw · 0.18mm/px · 1 of 1 slices shown (10 of 12)]
[im 1/1]
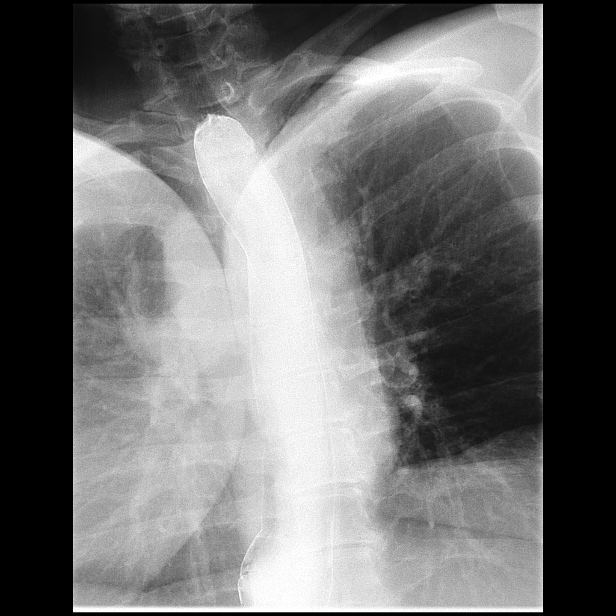

[Series 11: fluoro_barium singleshot_bw · 0.18mm/px · 1 of 1 slices shown (11 of 12)]
[im 1/1]
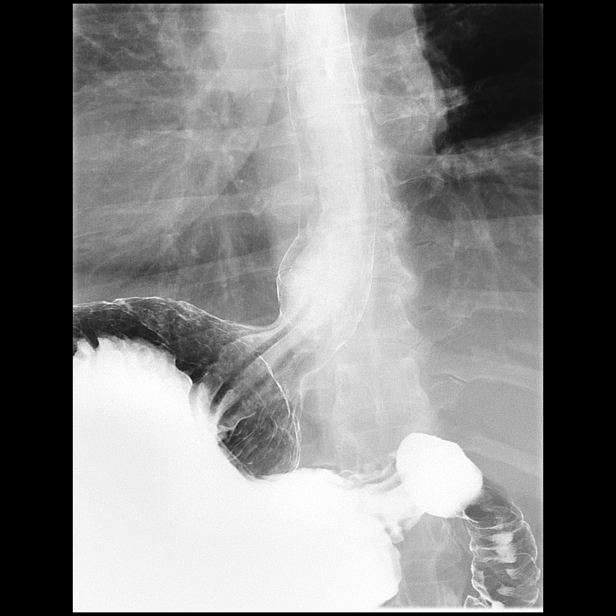

[Series 12: fluoro_barium singleshot_bw · 0.18mm/px · 1 of 1 slices shown (12 of 12)]
[im 1/1]
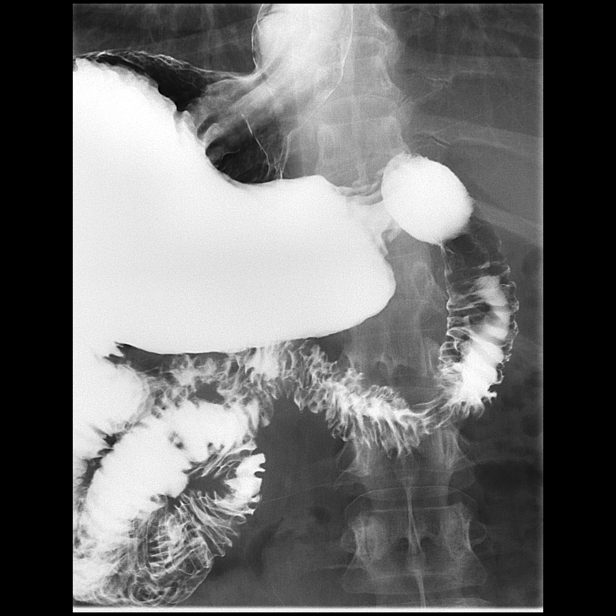

[12 of 12 positions shown; findings below may reference images not displayed]

FINDINGS: Thoracic esophagus is widely patent. No focal obstructing
abnormality identified. Small hiatal hernia. Prominent
gastroesophageal reflux noted. Small gastric polypoid densities are
noted some of which may have tiny ulcerations. Endoscopic evaluation
to further evaluate suggested. Duodenum bulb and C-loop are normal.
IMPRESSION: 1. Small hiatal hernia with prominent gastroesophageal reflux.
Esophagus is otherwise unremarkable.

2. Multiple tiny gastric polyps some of which may have ulcerations.
Endoscopic evaluation suggested for further evaluation .
